# Patient Record
Sex: Male | Born: 2000
Health system: Southern US, Community
[De-identification: ages and names within clinical notes are randomized; demographics above are authoritative.]

## PROBLEM LIST (undated history)

## (undated) DIAGNOSIS — F909 Attention-deficit hyperactivity disorder, unspecified type: Secondary | ICD-10-CM

## (undated) DIAGNOSIS — F845 Asperger's syndrome: Secondary | ICD-10-CM

## (undated) HISTORY — PX: TYMPANOSTOMY TUBE PLACEMENT: SHX32

## (undated) HISTORY — PX: TONSILLECTOMY: SUR1361

---
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
## 2018-05-24 IMAGING — CR DG ABDOMEN 1V
2 series · 2 of 2 positions shown · non-contrast
Comparison: None.

CLINICAL DATA: Nausea.  Evaluate for constipation.

EXAM:
ABDOMEN - 1 VIEW

[t abdomen supine (1 of 2)]
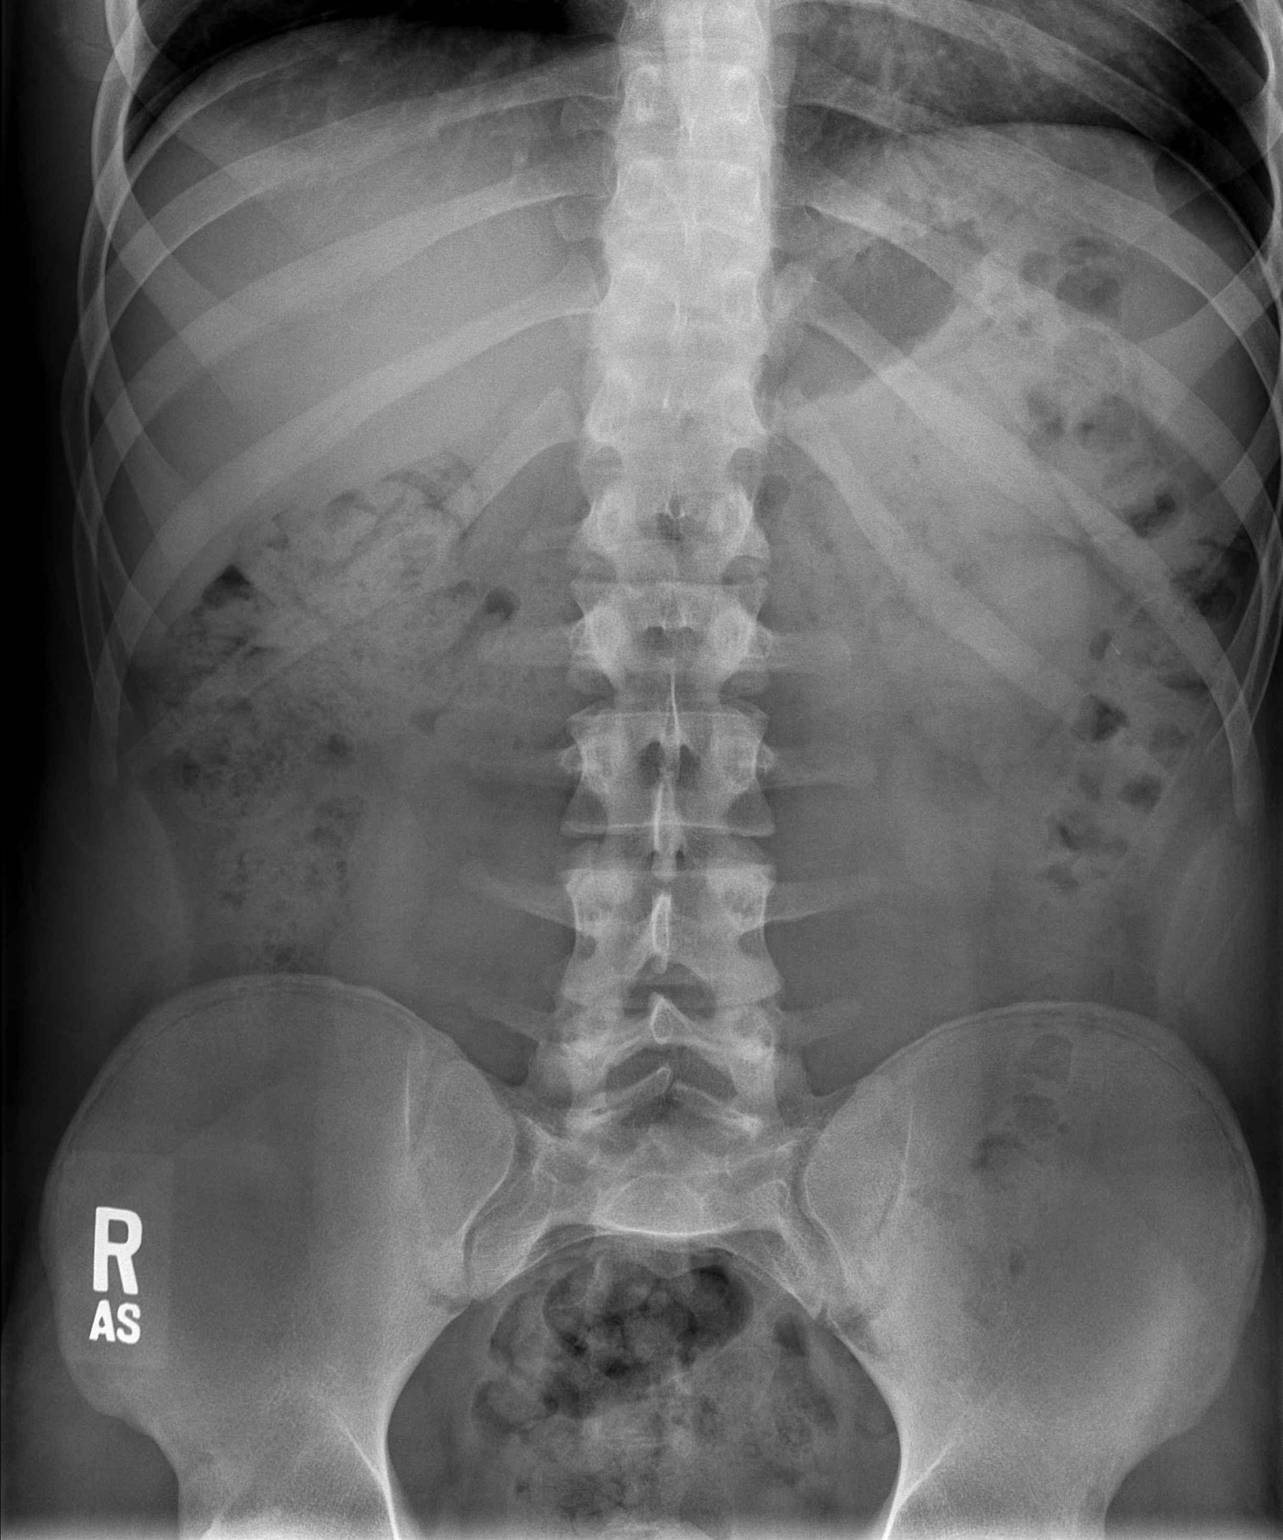

[t abdomen supine (2 of 2)]
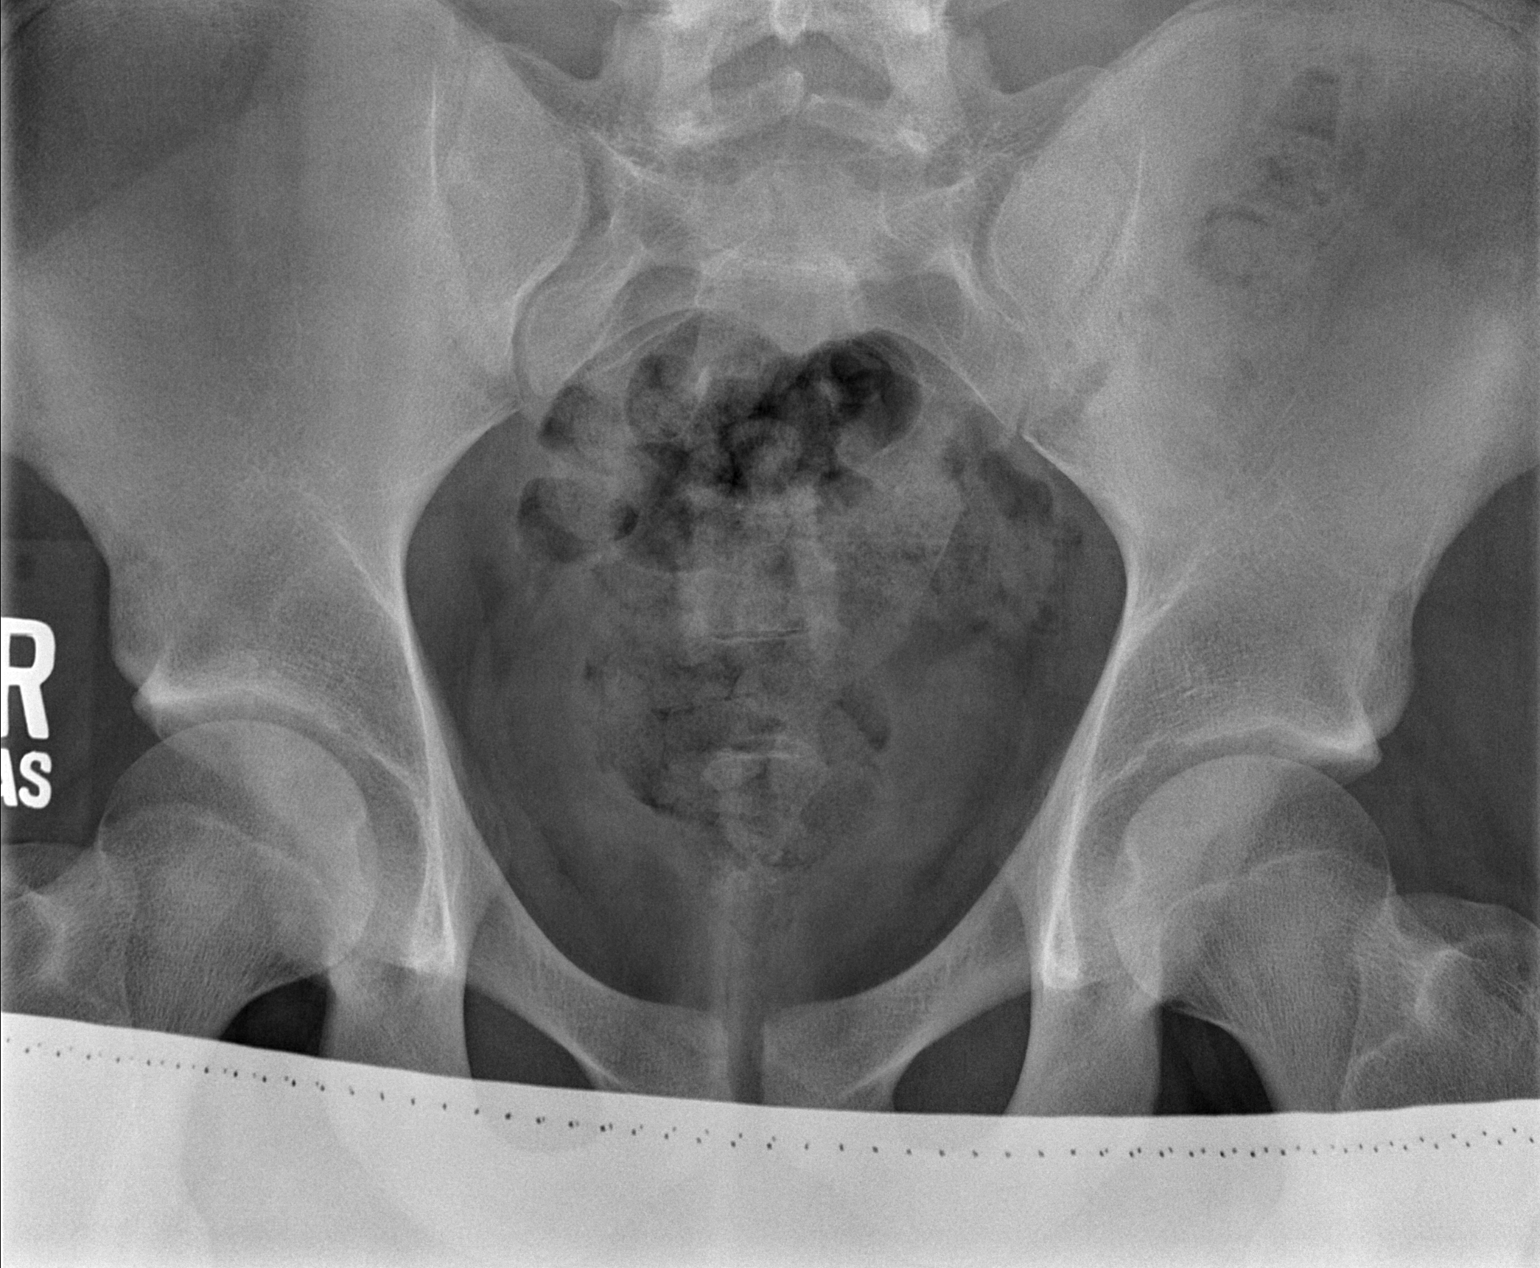

[2 of 2 positions shown; findings below may reference images not displayed]

FINDINGS: Moderate stool burden throughout the colon. There is a non
obstructive bowel gas pattern. No supine evidence of free air. No
organomegaly or suspicious calcification.No acute bony abnormality.
IMPRESSION: Moderate stool burden.  No acute findings.
